# Patient Record
Sex: Male | Born: 1945 | Race: White | Hispanic: No | Marital: Married | Smoking: Never smoker
Health system: Southern US, Community
[De-identification: ages and names within clinical notes are randomized; demographics above are authoritative.]

## PROBLEM LIST (undated history)

## (undated) DIAGNOSIS — E785 Hyperlipidemia, unspecified: Secondary | ICD-10-CM

## (undated) DIAGNOSIS — F1010 Alcohol abuse, uncomplicated: Secondary | ICD-10-CM

## (undated) DIAGNOSIS — G621 Alcoholic polyneuropathy: Secondary | ICD-10-CM

## (undated) DIAGNOSIS — I10 Essential (primary) hypertension: Secondary | ICD-10-CM

## (undated) DIAGNOSIS — F39 Unspecified mood [affective] disorder: Secondary | ICD-10-CM

## (undated) DIAGNOSIS — R413 Other amnesia: Secondary | ICD-10-CM

## (undated) HISTORY — DX: Hyperlipidemia, unspecified: E78.5

## (undated) HISTORY — PX: HAND SURGERY: SHX662

---
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
## 2007-08-28 ENCOUNTER — Ambulatory Visit: Payer: Self-pay | Admitting: Unknown Physician Specialty

## 2008-07-06 ENCOUNTER — Ambulatory Visit: Payer: Self-pay | Admitting: Internal Medicine

## 2009-11-28 IMAGING — US ABDOMEN ULTRASOUND
1 series · 17 of 25 positions shown · non-contrast
Comparison: none

REASON FOR EXAM: CALL REPORT [id number] abdominal pain  RUQ  epigastric
COMMENTS:

[Series 1: abdomen ultrasound · 17 of 53 slices shown]
[im 1/53]
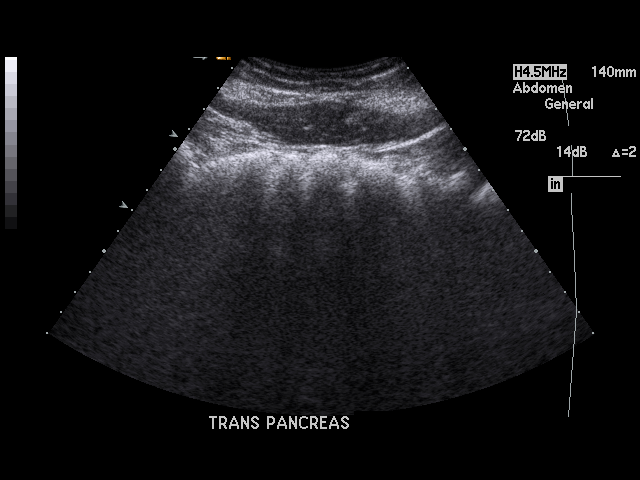
[im 5/53]
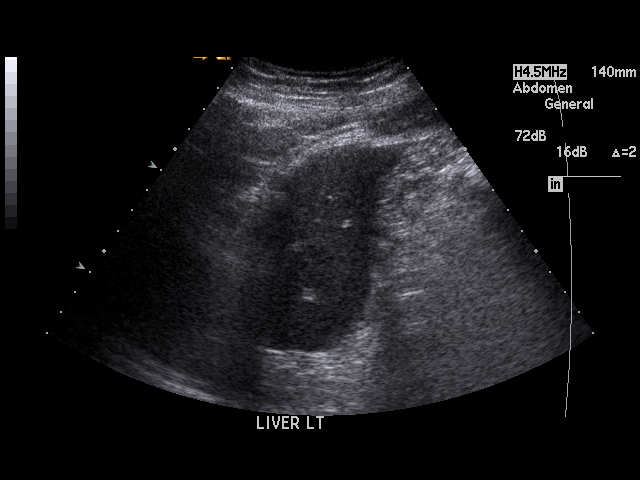
[im 7/53]
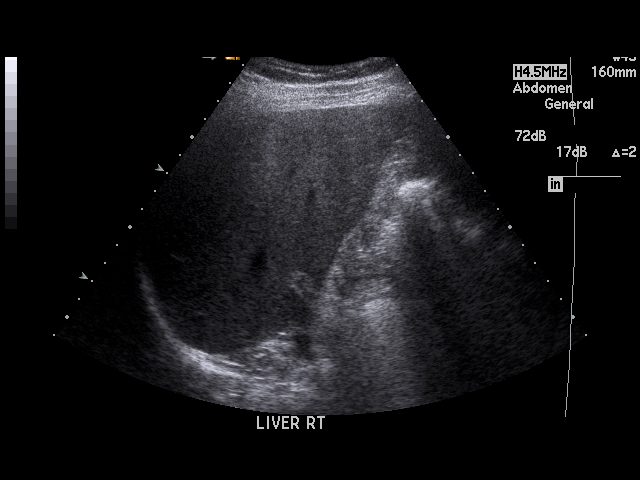
[im 11/53]
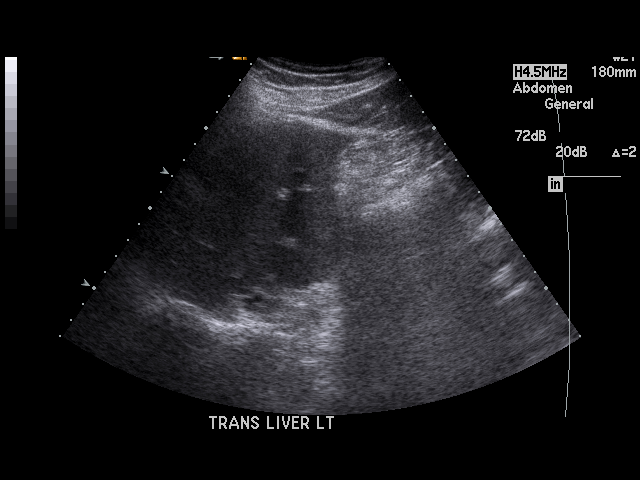
[im 14/53]
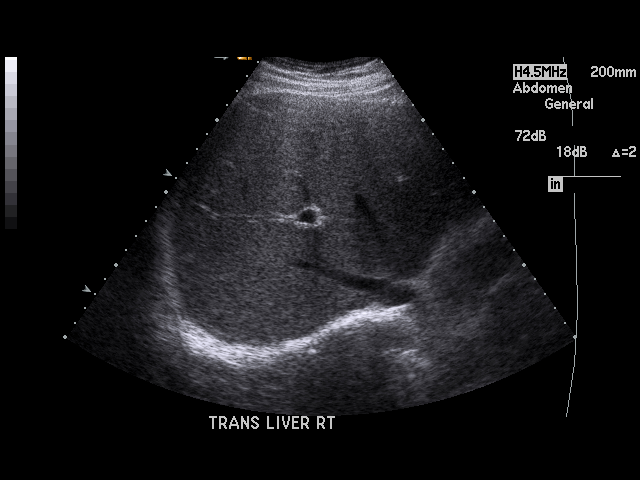
[im 18/53]
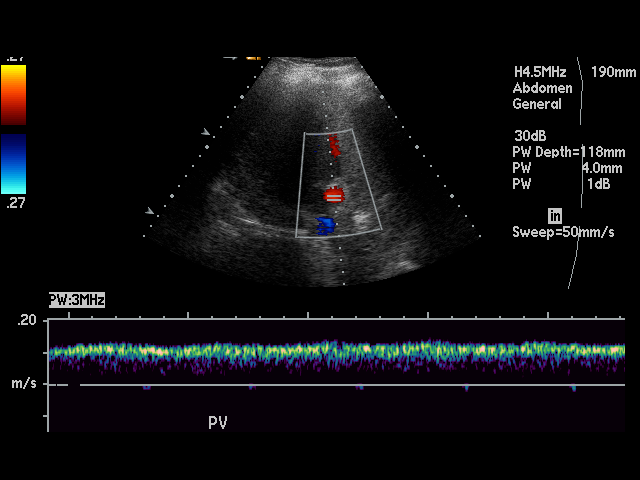
[im 20/53]
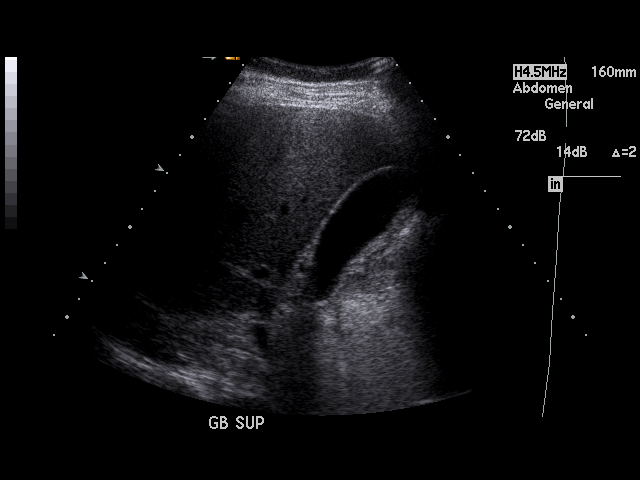
[im 24/53]
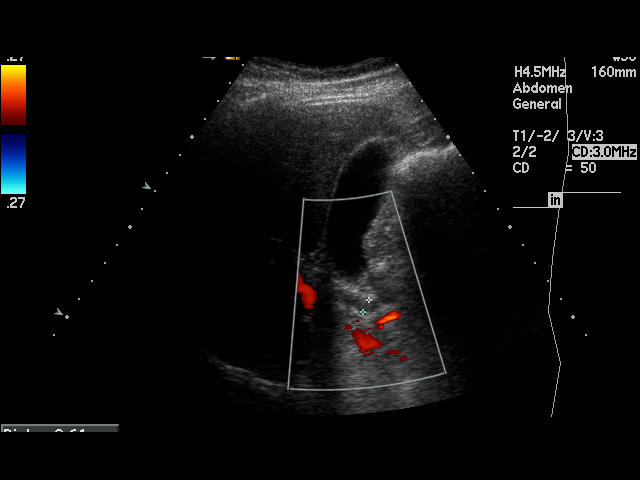
[im 27/53]
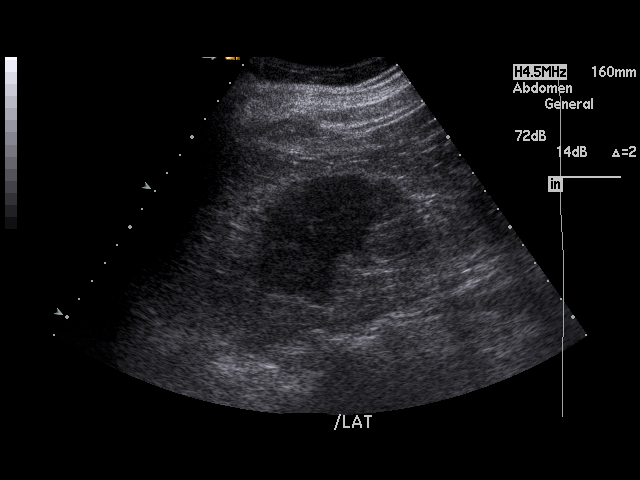
[im 29/53]
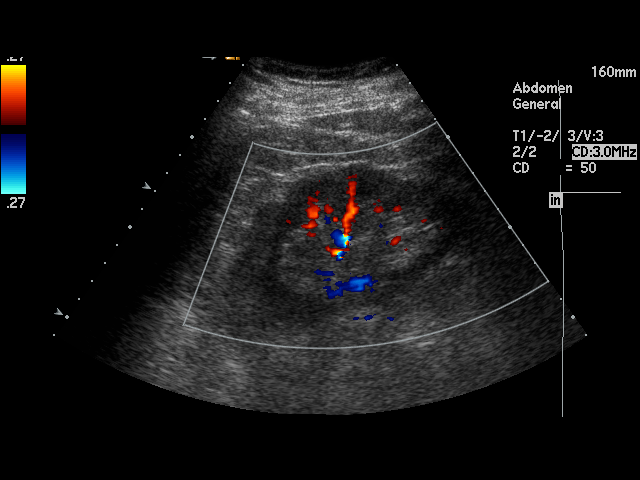
[im 33/53]
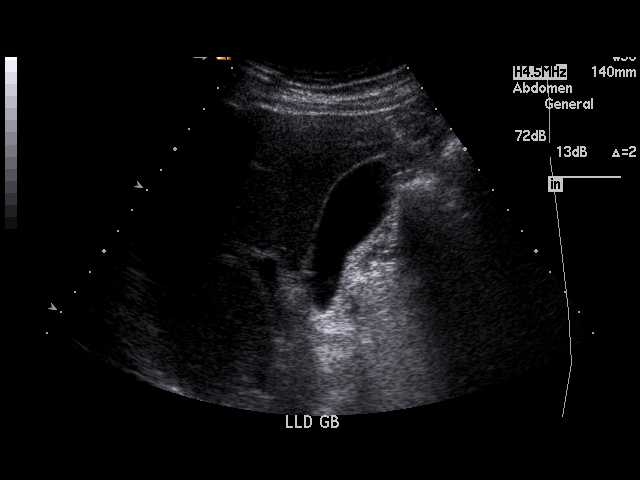
[im 35/53]
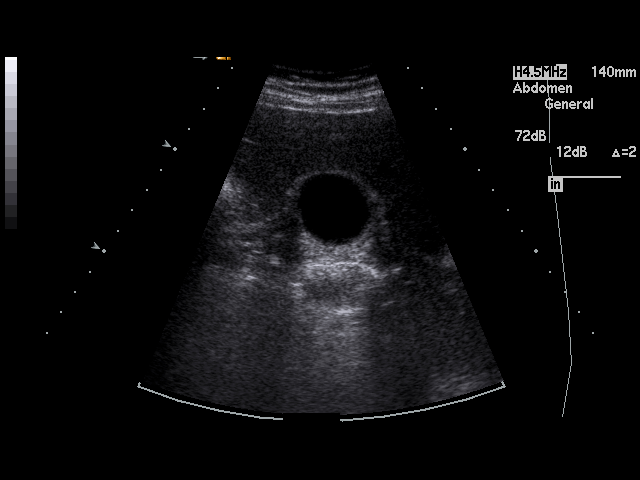
[im 40/53]
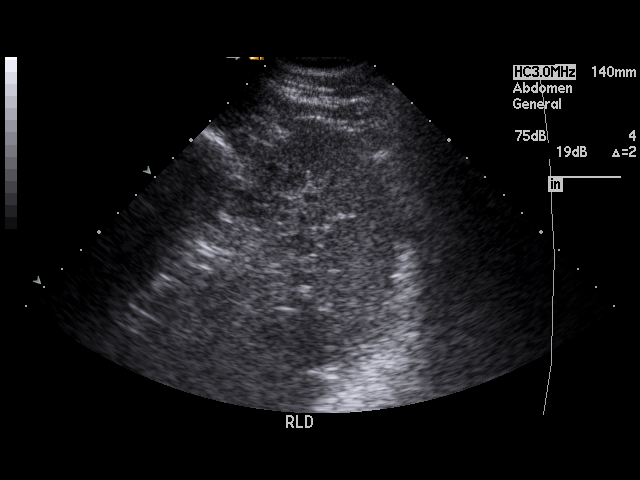
[im 42/53]
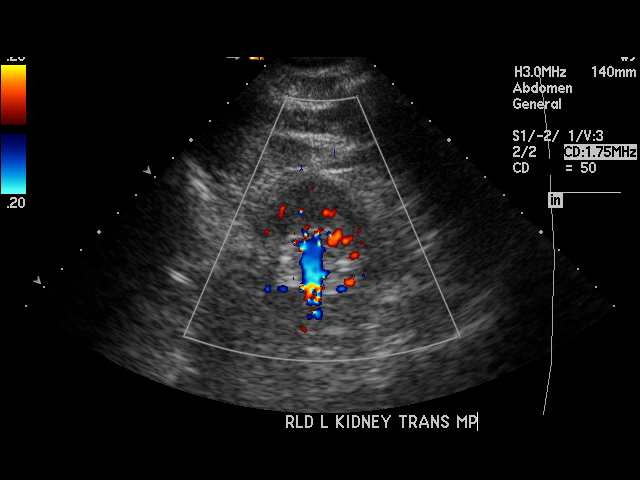
[im 46/53]
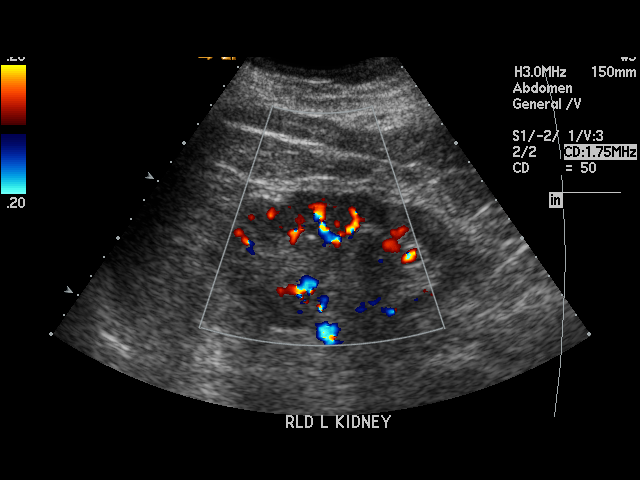
[im 48/53]
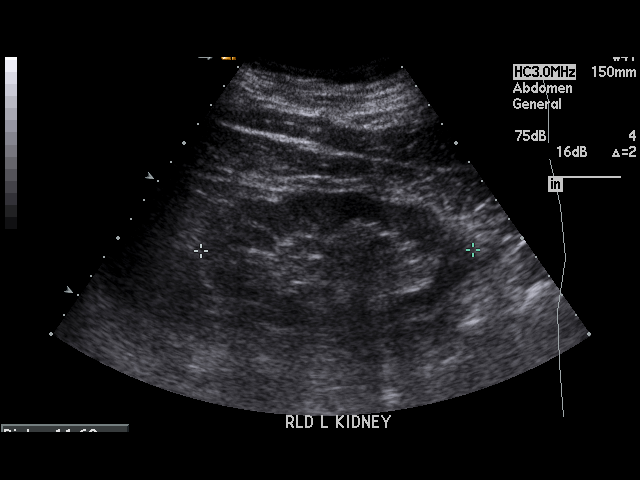
[im 53/53]
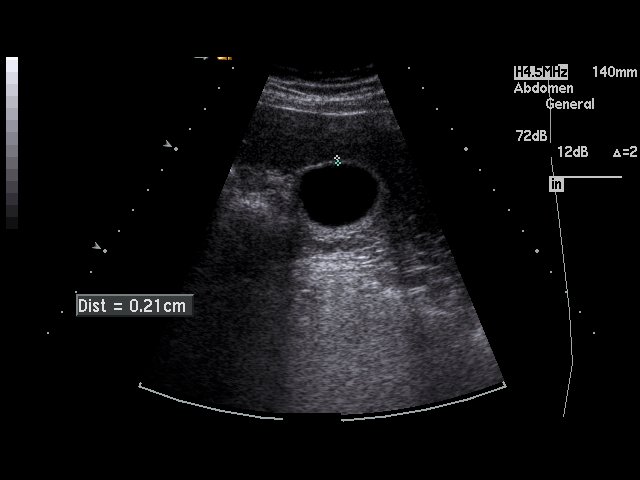

[17 of 25 positions shown; findings below may reference images not displayed]

PROCEDURE:     US  - US ABDOMEN GENERAL SURVEY  - July 06, 2008 [DATE]

RESULT:     The hepatic echo pattern is normal in appearance. Spleen size is
normal. The pancreas, abdominal aorta and inferior vena cava are in great
part obscured by bowel gas. No gallstones are seen. There is no thickening
of the gallbladder wall. The common bile duct measures 6.1 mm in diameter
which is within normal limits. The kidneys show no hydronephrosis. There is
no ascites.
IMPRESSION: 1. No gallstones or other acute change is identified.
2. The pancreas, abdominal aorta and inferior vena cava are in great part
obscured by bowel gas.

## 2014-03-31 DIAGNOSIS — F1010 Alcohol abuse, uncomplicated: Secondary | ICD-10-CM

## 2014-03-31 DIAGNOSIS — R413 Other amnesia: Secondary | ICD-10-CM

## 2014-03-31 DIAGNOSIS — F39 Unspecified mood [affective] disorder: Secondary | ICD-10-CM

## 2014-03-31 DIAGNOSIS — G621 Alcoholic polyneuropathy: Secondary | ICD-10-CM

## 2014-03-31 HISTORY — DX: Unspecified mood (affective) disorder: F39

## 2014-03-31 HISTORY — DX: Other amnesia: R41.3

## 2014-03-31 HISTORY — DX: Alcoholic polyneuropathy: G62.1

## 2014-03-31 HISTORY — DX: Alcohol abuse, uncomplicated: F10.10

## 2014-05-20 ENCOUNTER — Ambulatory Visit: Payer: Self-pay | Admitting: Unknown Physician Specialty

## 2014-09-28 LAB — SURGICAL PATHOLOGY

## 2018-12-10 ENCOUNTER — Other Ambulatory Visit: Payer: Self-pay

## 2018-12-10 DIAGNOSIS — R972 Elevated prostate specific antigen [PSA]: Secondary | ICD-10-CM

## 2018-12-13 ENCOUNTER — Encounter: Payer: Self-pay | Admitting: Urology

## 2018-12-13 ENCOUNTER — Other Ambulatory Visit
Admission: RE | Admit: 2018-12-13 | Discharge: 2018-12-13 | Disposition: A | Discharge status: Home / Self Care | Payer: Medicare Other | Attending: Urology | Admitting: Urology

## 2018-12-13 ENCOUNTER — Ambulatory Visit (INDEPENDENT_AMBULATORY_CARE_PROVIDER_SITE_OTHER): Payer: Medicare Other | Admitting: Urology

## 2018-12-13 ENCOUNTER — Other Ambulatory Visit: Payer: Self-pay

## 2018-12-13 VITALS — BP 137/83 | HR 73 | Ht 66.0 in | Wt 183.0 lb

## 2018-12-13 DIAGNOSIS — R972 Elevated prostate specific antigen [PSA]: Secondary | ICD-10-CM | POA: Insufficient documentation

## 2018-12-13 DIAGNOSIS — I10 Essential (primary) hypertension: Secondary | ICD-10-CM

## 2018-12-13 HISTORY — DX: Essential (primary) hypertension: I10

## 2018-12-13 LAB — URINALYSIS, COMPLETE (UACMP) WITH MICROSCOPIC
Bacteria, UA: NONE SEEN
Bilirubin Urine: NEGATIVE
Glucose, UA: NEGATIVE mg/dL
Ketones, ur: NEGATIVE mg/dL
Leukocytes,Ua: NEGATIVE
Nitrite: NEGATIVE
Protein, ur: NEGATIVE mg/dL
Specific Gravity, Urine: <1.005 — ABNORMAL LOW (ref 1.005–1.030)
Squamous Epithelial / LPF: NONE SEEN (ref 0–5)
WBC, UA: NONE SEEN WBC/hpf (ref 0–5)
pH: 6 (ref 5.0–8.0)

## 2018-12-13 LAB — PSA: Prostatic Specific Antigen: 4.13 ng/mL — ABNORMAL HIGH (ref 0.00–4.00)

## 2018-12-13 NOTE — Patient Instructions (Signed)

## 2018-12-13 NOTE — Progress Notes (Signed)
12/13/2018 3:02 PM   Joseph SartoriusRoger D Juarez Apr 11, 1946 [id number]  Referring provider: Jaclyn Shaggyate, Denny C, MD [address]   [place],  [place] [id number]  Chief Complaint  Patient presents with  . Elevated PSA    New Patient    HPI: 73 year old male referred for further evaluation of elevated PSA.  He had a PSA checked by his primary care, Dr. Arlana Pouchate in 10/23/2018 at which time his PSA was noted to be elevated to 5.1.  This is up from his previous PSA a year ago which time his PSA was 3.6 on 10/2017.  Creatinine is also elevated to 1.38, baseline unknown.  He denies any urinary symptoms.  He occasionally has urinary frequency but is not bothersome to him.  He is a good stream feels like he is able to empty well.  No history of UTIs or bladder stones.  No family history of prostate cancer.  No weight loss.   PMH: Past Medical History:  Diagnosis Date  . Alcohol abuse 03/31/2014  . 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 Hyperlipemia   . Hypertension 12/13/2018  . Memory loss, short term 03/31/2014  . Mood disorder (HCC) 03/31/2014    Surgical History: Past Surgical History:  Procedure Laterality Date  . HAND SURGERY      Home Medications:  Allergies as of 12/13/2018      Reactions   Penicillin G Rash      Medication List       Accurate as of December 13, 2018  3:02 PM. If you have any questions, ask your nurse or doctor.        acetaminophen-codeine 300-30 MG tablet Commonly known as: TYLENOL #3 TK 1 T PO Q 6 H PRN   atorvastatin 40 MG tablet Commonly known as: LIPITOR Take by mouth.   diazepam 2 MG tablet Commonly known as: VALIUM TK 1 T PO QHS   losartan 100 MG tablet Commonly known as: COZAAR TK 1 T PO QD       Allergies:  Allergies  Allergen Reactions  . Penicillin G Rash    Family History: Family History  Problem Relation Age of Onset  . Prostate cancer Neg Hx   . Kidney cancer Neg Hx     Social History:  reports that he has  never smoked. He has never used smokeless tobacco. He reports current alcohol use of about 2.0 standard drinks of alcohol per week. No history on file for drug.  ROS: UROLOGY Frequent Urination?: No Hard to postpone urination?: No Burning/pain with urination?: No Get up at night to urinate?: No Leakage of urine?: Yes Urine stream starts and stops?: No Trouble starting stream?: No Do you have to strain to urinate?: No Blood in urine?: No Urinary tract infection?: No Sexually transmitted disease?: No Injury to kidneys or bladder?: No Painful intercourse?: No Weak stream?: Yes Erection problems?: No Penile pain?: No  Gastrointestinal Nausea?: No Vomiting?: No Indigestion/heartburn?: No Diarrhea?: No Constipation?: No  Constitutional Fever: No Night sweats?: No Weight loss?: No Fatigue?: No  Skin Skin rash/lesions?: No Itching?: No  Eyes Blurred vision?: No Double vision?: No  Ears/Nose/Throat Sore throat?: No Sinus problems?: No  Hematologic/Lymphatic Swollen glands?: No Easy bruising?: No  Cardiovascular Leg swelling?: No Chest pain?: No  Respiratory Cough?: No Shortness of breath?: No  Endocrine Excessive thirst?: No  Musculoskeletal Back pain?: No Joint pain?: No  Neurological Headaches?: Yes Dizziness?: No  Psychologic Depression?: No Anxiety?: No  Physical Exam: BP 137/83  Pulse 73   Ht 5\' 6"  (1.676 m)   Wt 183 lb (83 kg)   BMI 29.54 kg/m   Constitutional:  Alert and oriented, No acute distress. HEENT: [place] AT, moist mucus membranes.  Trachea midline, no masses. Cardiovascular: No clubbing, cyanosis, or edema. Respiratory: Normal respiratory effort, no increased work of breathing. GI: Abdomen is soft, nontender, + external hemorrhoids, enlarged 50 g prostate with induration at the right base as well as subtle induration on the left lateral border.  No discrete nodules. Skin: No rashes, bruises or suspicious lesions. Neurologic:  Grossly intact, no focal deficits, moving all 4 extremities. Psychiatric: Normal mood and affect.  Laboratory Data: As above  Urinalysis    Component Value Date/Time   COLORURINE STRAW (A) 12/13/2018 1355   APPEARANCEUR CLEAR 12/13/2018 1355   LABSPEC <1.005 (L) 12/13/2018 1355   PHURINE 6.0 12/13/2018 1355   GLUCOSEU NEGATIVE 12/13/2018 1355   HGBUR TRACE (A) 12/13/2018 1355   BILIRUBINUR NEGATIVE 12/13/2018 1355   KETONESUR NEGATIVE 12/13/2018 1355   PROTEINUR NEGATIVE 12/13/2018 1355   NITRITE NEGATIVE 12/13/2018 1355   LEUKOCYTESUR NEGATIVE 12/13/2018 1355    Lab Results  Component Value Date   BACTERIA NONE SEEN 12/13/2018    Pertinent Imaging: N/a  Assessment & Plan:    1. Elevated PSA  We reviewed the implications of an elevated PSA and the uncertainty surrounding it. In general, a man's PSA increases with age and is produced by both normal and cancerous prostate tissue. Differential for elevated PSA is BPH, prostate cancer, infection, recent intercourse/ejaculation, prostate infarction, recent urethroscopic manipulation (foley placement/cystoscopy) and prostatitis. Management of an elevated PSA can include observation or prostate biopsy and wediscussed this in detail. We discussed that indications for prostate biopsy are defined by age and race specific PSA cutoffs as well as a PSA velocity of 0.75/year.  We will repeat his PSA today.  He has some induration bilaterally in his prostate but no discrete nodules.  If his PSA remains elevated, would recommend prostate biopsy.  We discussed prostate biopsy in detail including the procedure itself, the risks of blood in the urine, stool, and ejaculate, serious infection, and discomfort. He is willing to proceed with this as discussed.  PSA; Future .  Return for will call with results and plan.  Hollice Espy, MD  [facility] [address], [place], [place] [address] [phone]

## 2018-12-16 ENCOUNTER — Telehealth: Payer: Self-pay

## 2018-12-16 DIAGNOSIS — R972 Elevated prostate specific antigen [PSA]: Secondary | ICD-10-CM

## 2018-12-16 NOTE — Telephone Encounter (Signed)
-----   Message from Hollice Espy, MD sent at 12/16/2018  9:08 AM EDT ----- PSA is trending back down.  Repeat PSA was 4.13.  It seems reasonable to recheck it again in 1 year and reassess.  Please arrange for this appointment with PSA prior.  Hollice Espy, MD

## 2018-12-16 NOTE — Telephone Encounter (Signed)
Called pt informed he and his wife about the results below. Pt gave verbal understanding. Appts scheduled, order placed.

## 2019-12-12 ENCOUNTER — Other Ambulatory Visit: Payer: Medicare Other

## 2019-12-17 ENCOUNTER — Ambulatory Visit: Payer: Medicare Other | Admitting: Urology

## 2021-11-17 ENCOUNTER — Other Ambulatory Visit: Payer: Self-pay | Admitting: General Surgery

## 2021-11-17 NOTE — Progress Notes (Signed)
Subjective:     Patient ID: Joseph Juarez is a 76 y.o. male.   HPI   The following portions of the patient's history were reviewed and updated as appropriate.   This a new patient is here today for: office visit. Here today to discuss having a colonoscopy referred by Dr Arlana Pouch. Bowels move every 3-4 days, no bleeding. He does use preparation H for hemorrhoids as needed. He states he uses metamucil when he "can remember".     Review of Systems  Constitutional:  Negative for chills and fever.  Respiratory:  Negative for cough.          Chief Complaint  Patient presents with   Pre-op Exam      BP 128/80   Pulse 64   Temp 36.7 C (98.1 F)   Ht 167.6 cm (5\' 6" )   Wt 83.9 kg (185 lb)   SpO2 100%   BMI 29.86 kg/m        Past Medical History:  Diagnosis Date   Alcohol abuse 03/31/2014   Alcohol-induced polyneuropathy (CMS-HCC) 03/31/2014   Cluster headache     Hyperlipidemia     Hypertension 12/13/2018   Memory loss, short term 03/31/2014   Mood disorder (CMS-HCC) 03/31/2014           Past Surgical History:  Procedure Laterality Date   COLONOSCOPY   08/28/2007    Adenomatous Polyp: CBF 08/2012   EGD   08/28/2007   COLONOSCOPY   05/20/2014    Adenomatous Polyp: CBF 05/2019   hand surgery                Social History           Socioeconomic History   Marital status: Married  Tobacco Use   Smoking status: Never      Passive exposure: Never   Smokeless tobacco: Never  Substance and Sexual Activity   Alcohol use: Yes      Comment: beer occasionally   Drug use: Never            Allergies  Allergen Reactions   Penicillin Rash      Current Medications        Current Outpatient Medications  Medication Sig Dispense Refill   acetaminophen-codeine (TYLENOL #3) 300-30 mg tablet Take 1 tablet by mouth every 6 (six) hours as needed       atorvastatin (LIPITOR) 40 MG tablet Take 40 mg by mouth once daily.       diazepam (VALIUM) 2 MG tablet Take 1 mg  by mouth at bedtime       famotidine (PEPCID) 10 MG tablet Take 10 mg by mouth as needed for Heartburn       hydrocortisone 1 % cream Apply topically as needed       losartan (COZAAR) 100 MG tablet Take 100 mg by mouth once daily.       psyllium, sugar, (METAMUCIL) 3.4 gram packet Take 1 packet by mouth as needed for Constipation       butalbital-aspirin-caffeine-codeine (FIORINAL WITH CODEINE) capsule Take 1 capsule by mouth every 4 (four) hours as needed for Pain. (Patient not taking: Reported on 11/17/2021)       divalproex (DEPAKOTE) 125 MG DR tablet TAKE 1 TABLET BY MOUTH TWICE DAILY (Patient not taking: Reported on 11/17/2021) 60 tablet 0   polyethylene glycol (MIRALAX) powder Take as directed for colonoscopy prep. (Patient not taking: Reported on 11/17/2021) 255 g 0   ranitidine (ZANTAC) 150 MG  capsule Take 150 mg by mouth as needed for Heartburn. (Patient not taking: Reported on 11/17/2021)        No current facility-administered medications for this visit.             Family History  Problem Relation Age of Onset   Colon cancer Neg Hx     Breast cancer Neg Hx          Labs and Radiology:    May 20, 2014 colonoscopy pathology:   DIAGNOSIS:  A. COLON POLYP --2, ASCENDING; COLD BIOPSY:  - TUBULAR ADENOMA (MULTIPLE FRAGMENTS).  - NEGATIVE FOR HIGH-GRADE DYSPLASIA AND MALIGNANCY.    PCP laboratory dated August 26, 2021:   Hemoglobin 17.0, MCV 93, platelet count 234,000, white blood cell count 8500 with normal differential.   Creatinine 1.31 with an estimated GFR 57.  Normal electrolytes.  Mildly elevated serum SGPT at 46 (0-44).  PSA 5.3, down from 6.0.  TSH 4.2, stable.  Urinalysis negative.          Objective:   Physical Exam Constitutional:      Appearance: Normal appearance.  Cardiovascular:     Rate and Rhythm: Normal rate and regular rhythm.     Pulses: Normal pulses.     Heart sounds: Normal heart sounds.  Pulmonary:     Effort: Pulmonary effort is normal.      Breath sounds: Normal breath sounds.  Abdominal:     Hernia: A hernia is present. Hernia is present in the umbilical area (Partially reducible, defect < 2 cm.).     Comments: Diastasis recti. Fascia intact.   Musculoskeletal:     Cervical back: Neck supple.  Skin:    General: Skin is warm and dry.  Neurological:     Mental Status: He is alert and oriented to person, place, and time.  Psychiatric:        Mood and Affect: Mood normal.        Behavior: Behavior normal.         Assessment:     Past history colon polyp ascending colon, candidate for repeat endoscopy.   Diastases recti, reassured.   Umbilical hernia with partially incarcerated fat, varies in size by patient report.    Plan:     Indications for follow-up colonoscopy were reviewed.  Risks of the procedure discussed including perforation.   The umbilical hernia has been present probably for 3-4 years by his report.  We will discuss after his colonoscopy whether he would consider elective repair.          This note is partially prepared by Dorathy Daft, RN, acting as a scribe in the presence of Dr. Donnalee Curry, MD.  The documentation recorded by the scribe accurately reflects the service I personally performed and the decisions made by me.    Earline Mayotte, MD FACS

## 2021-11-29 ENCOUNTER — Encounter: Payer: Self-pay | Admitting: General Surgery

## 2021-11-30 ENCOUNTER — Ambulatory Visit: Payer: Medicare PPO | Admitting: Certified Registered"

## 2021-11-30 ENCOUNTER — Encounter
Admission: RE | Disposition: A | Discharge status: Home / Self Care | Payer: Self-pay | Source: Home / Self Care | Attending: General Surgery

## 2021-11-30 ENCOUNTER — Ambulatory Visit
Admission: RE | Admit: 2021-11-30 | Discharge: 2021-11-30 | Disposition: A | Discharge status: Home / Self Care | Payer: Medicare PPO | Attending: General Surgery | Admitting: General Surgery

## 2021-11-30 ENCOUNTER — Encounter: Payer: Self-pay | Admitting: General Surgery

## 2021-11-30 ENCOUNTER — Other Ambulatory Visit: Payer: Self-pay

## 2021-11-30 DIAGNOSIS — K219 Gastro-esophageal reflux disease without esophagitis: Secondary | ICD-10-CM | POA: Insufficient documentation

## 2021-11-30 DIAGNOSIS — Z1211 Encounter for screening for malignant neoplasm of colon: Secondary | ICD-10-CM | POA: Diagnosis present

## 2021-11-30 DIAGNOSIS — K635 Polyp of colon: Secondary | ICD-10-CM | POA: Diagnosis not present

## 2021-11-30 DIAGNOSIS — I10 Essential (primary) hypertension: Secondary | ICD-10-CM | POA: Diagnosis not present

## 2021-11-30 HISTORY — PX: COLONOSCOPY WITH PROPOFOL: SHX5780

## 2021-11-30 SURGERY — COLONOSCOPY WITH PROPOFOL
Anesthesia: General

## 2021-11-30 MED ORDER — PROPOFOL 10 MG/ML IV BOLUS
INTRAVENOUS | Status: DC | PRN
  Administered 2021-11-30: 70 mg via INTRAVENOUS

## 2021-11-30 MED ORDER — LIDOCAINE HCL (CARDIAC) PF 100 MG/5ML IV SOSY
PREFILLED_SYRINGE | INTRAVENOUS | Status: DC | PRN
  Administered 2021-11-30: 100 mg via INTRAVENOUS

## 2021-11-30 MED ORDER — PROPOFOL 500 MG/50ML IV EMUL
INTRAVENOUS | Status: DC | PRN
  Administered 2021-11-30: 130 ug/kg/min via INTRAVENOUS

## 2021-11-30 MED ORDER — SODIUM CHLORIDE 0.9 % IV SOLN: INTRAVENOUS | Status: DC

## 2021-11-30 NOTE — Op Note (Signed)
[facility] Patient Name: Joseph Juarez Procedure Date: 11/30/2021 8:10 AM MRN: [id number] Account #: [id number] Date of Birth: [birth year] Admit Type: Outpatient Age: 76 Room: [facility] ENDO ROOM 1 Gender: Male Note Status: Finalized Instrument Name: Peds Colonoscope [id number] Procedure:             Colonoscopy Indications:           High risk colon cancer surveillance: Personal history                         of colonic polyps Providers:             Earline Mayotte, MD Referring MD:          Jillene Bucks. Arlana Pouch, MD (Referring MD) Medicines:             Propofol per Anesthesia Complications:         No immediate complications. Procedure:             Pre-Anesthesia Assessment:                        - Prior to the procedure, a History and Physical was                         performed, and patient medications, allergies and                         sensitivities were reviewed. The patient's tolerance                         of previous anesthesia was reviewed.                        - The risks and benefits of the procedure and the                         sedation options and risks were discussed with the                         patient. All questions were answered and informed                         consent was obtained.                        After obtaining informed consent, the colonoscope was                         passed under direct vision. Throughout the procedure,                         the patient's blood pressure, pulse, and oxygen                         saturations were monitored continuously. The                         Colonoscope was introduced through the anus and  advanced to the the cecum, identified by appendiceal                         orifice and ileocecal valve. The colonoscopy was                         performed without difficulty. The patient tolerated                         the procedure well. The  quality of the bowel                         preparation was excellent. Findings:      A 3 mm polyp was found in the cecum. The polyp was sessile. Biopsies       were taken with a cold forceps for histology.      The retroflexed view of the distal rectum and anal verge was normal and       showed no anal or rectal abnormalities. Impression:            - One 3 mm polyp in the cecum. Biopsied.                        - The distal rectum and anal verge are normal on                         retroflexion view. Recommendation:        - Telephone endoscopist for pathology results in 1                         week. Procedure Code(s):     --- Professional ---                        [phone], Colonoscopy, flexible; with biopsy, single or                         multiple Diagnosis Code(s):     --- Professional ---                        Z86.010, Personal history of colonic polyps                        K63.5, Polyp of colon CPT copyright 2019 American Medical Association. All rights reserved. The codes documented in this report are preliminary and upon coder review may  be revised to meet current compliance requirements. Earline Mayotte, MD 11/30/2021 8:37:53 AM This report has been signed electronically. Number of Addenda: 0 Note Initiated On: 11/30/2021 8:10 AM Scope Withdrawal Time: 0 hours 11 minutes 12 seconds  Total Procedure Duration: 0 hours 17 minutes 32 seconds  Estimated Blood Loss:  Estimated blood loss: none.      [facility]

## 2021-11-30 NOTE — Transfer of Care (Signed)
Immediate Anesthesia Transfer of Care Note  Patient: Joseph Juarez  Procedure(s) Performed: COLONOSCOPY WITH PROPOFOL  Patient Location: PACU and Endoscopy Unit  Anesthesia Type:General  Level of Consciousness: drowsy  Airway & Oxygen Therapy: Patient Spontanous Breathing  Post-op Assessment: Report given to RN  Post vital signs: stable  Last Vitals:  Vitals Value Taken Time  BP    Temp    Pulse 79 11/30/21 0840  Resp 21 11/30/21 0840  SpO2 98 % 11/30/21 0840  Vitals shown include unvalidated device data.  Last Pain:  Vitals:   11/30/21 0751  TempSrc: Temporal  PainSc: 0-No pain         Complications: No notable events documented.

## 2021-11-30 NOTE — Anesthesia Preprocedure Evaluation (Signed)
Anesthesia Evaluation  Patient identified by MRN, date of birth, ID band Patient awake    Reviewed: Allergy & Precautions, H&P , NPO status , Patient's Chart, lab work & pertinent test results, reviewed documented beta blocker date and time   History of Anesthesia Complications Negative for: history of anesthetic complications  Airway Mallampati: I  TM Distance: >3 FB Neck ROM: full    Dental  (+) Dental Advidsory Given, Teeth Intact   Pulmonary neg pulmonary ROS,    Pulmonary exam normal breath sounds clear to auscultation       Cardiovascular Exercise Tolerance: Good hypertension, (-) angina(-) Past MI and (-) Cardiac Stents Normal cardiovascular exam(-) dysrhythmias (-) Valvular Problems/Murmurs Rhythm:regular Rate:Normal     Neuro/Psych negative neurological ROS  negative psych ROS   GI/Hepatic Neg liver ROS, GERD  ,  Endo/Other  negative endocrine ROS  Renal/GU negative Renal ROS  negative genitourinary   Musculoskeletal   Abdominal   Peds  Hematology negative hematology ROS (+)   Anesthesia Other Findings Past Medical History: 03/31/2014: Alcohol abuse 03/31/2014: Alcohol-induced polyneuropathy (HCC) No date: Hyperlipemia 12/13/2018: Hypertension 03/31/2014: Memory loss, short term 03/31/2014: Mood disorder (HCC)   Reproductive/Obstetrics negative OB ROS                             Anesthesia Physical Anesthesia Plan  ASA: 2  Anesthesia Plan: General   Post-op Pain Management:    Induction: Intravenous  PONV Risk Score and Plan: 2 and Propofol infusion and TIVA  Airway Management Planned: Natural Airway and Nasal Cannula  Additional Equipment:   Intra-op Plan:   Post-operative Plan:   Informed Consent: I have reviewed the patients History and Physical, chart, labs and discussed the procedure including the risks, benefits and alternatives for the proposed  anesthesia with the patient or authorized representative who has indicated his/her understanding and acceptance.     Dental Advisory Given  Plan Discussed with: Anesthesiologist, CRNA and Surgeon  Anesthesia Plan Comments:         Anesthesia Quick Evaluation

## 2021-11-30 NOTE — H&P (Signed)
Joseph Juarez [id number] 1945-08-23     HPI: Healthy male with history of tubular adenomas in 2015 in the ascending colon. For follow up exam.   Medications Prior to Admission  Medication Sig Dispense Refill Last Dose   famotidine (PEPCID) 10 MG tablet Take 10 mg by mouth 2 (two) times daily.   11/29/2021   losartan (COZAAR) 100 MG tablet TK 1 T PO QD   11/29/2021   ranitidine (ZANTAC) 150 MG capsule Take 150 mg by mouth 2 (two) times daily.   Past Week   acetaminophen-codeine (TYLENOL #3) 300-30 MG tablet TK 1 T PO Q 6 H PRN   11/27/2021   atorvastatin (LIPITOR) 40 MG tablet Take by mouth.   11/27/2021   diazepam (VALIUM) 2 MG tablet TK 1 T PO QHS   11/27/2021   divalproex (DEPAKOTE) 125 MG DR tablet Take 125 mg by mouth 3 (three) times daily. 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   Hyperlipemia    Hypertension 12/13/2018   Memory loss, short term 03/31/2014   Mood disorder (HCC) 03/31/2014   Past Surgical History:  Procedure Laterality Date   HAND SURGERY     Social History   Socioeconomic History   Marital status: Married    Spouse name: Not on file   Number of children: Not on file   Years of education: Not on file   Highest education level: Not on file  Occupational History   Not on file  Tobacco Use   Smoking status: Never   Smokeless tobacco: Never  Vaping Use   Vaping Use: Never used  Substance and Sexual Activity   Alcohol use: Yes    Alcohol/week: 2.0 standard drinks of alcohol    Types: 2 Cans of beer per week    Comment: sunday last drink   Drug use: Never   Sexual activity: Not on file  Other Topics Concern   Not on file  Social History Narrative   Not on file   Social Determinants of Health   Financial Resource Strain: Not on file  Food Insecurity: Not on  file  Transportation Needs: Not on file  Physical Activity: Not on file  Stress: Not on file  Social Connections: Not on file  Intimate Partner Violence: Not on file   Social History   Social History Narrative   Not on file     ROS: Negative.     PE: HEENT: Negative. Lungs: Clear. Cardio: RR.  Assessment/Plan:  Proceed with planned endoscopy.   Merrily Pew [facility] 11/30/2021

## 2021-12-01 ENCOUNTER — Encounter: Payer: Self-pay | Admitting: General Surgery

## 2021-12-01 LAB — SURGICAL PATHOLOGY

## 2021-12-06 NOTE — Anesthesia Postprocedure Evaluation (Signed)
Anesthesia Post Note  Patient: Joseph Juarez  Procedure(s) Performed: COLONOSCOPY WITH PROPOFOL  Patient location during evaluation: Endoscopy Anesthesia Type: General Level of consciousness: awake and alert Pain management: pain level controlled Vital Signs Assessment: post-procedure vital signs reviewed and stable Respiratory status: spontaneous breathing, nonlabored ventilation, respiratory function stable and patient connected to nasal cannula oxygen Cardiovascular status: blood pressure returned to baseline and stable Postop Assessment: no apparent nausea or vomiting Anesthetic complications: no   No notable events documented.   Last Vitals:  Vitals:   11/30/21 0849 11/30/21 0859  BP: 131/84 137/79  Pulse: 73 67  Resp: 18 12  Temp:    SpO2: 100% 100%    Last Pain:  Vitals:   11/30/21 0859  TempSrc:   PainSc: 0-No pain                 Lenard Simmer
